# Patient Record
Sex: Male | Born: 1995 | Race: White | Hispanic: No | Marital: Married | Smoking: Never smoker
Health system: Southern US, Community
[De-identification: ages and names within clinical notes are randomized; demographics above are authoritative.]

## PROBLEM LIST (undated history)

## (undated) DIAGNOSIS — H9190 Unspecified hearing loss, unspecified ear: Secondary | ICD-10-CM

---
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
## 2016-12-08 ENCOUNTER — Encounter: Payer: Self-pay | Admitting: Emergency Medicine

## 2016-12-08 ENCOUNTER — Emergency Department
Admission: EM | Admit: 2016-12-08 | Discharge: 2016-12-08 | Disposition: A | Discharge status: Home / Self Care | Payer: Worker's Compensation | Attending: Emergency Medicine | Admitting: Emergency Medicine

## 2016-12-08 ENCOUNTER — Emergency Department: Payer: Worker's Compensation

## 2016-12-08 DIAGNOSIS — S91209A Unspecified open wound of unspecified toe(s) with damage to nail, initial encounter: Secondary | ICD-10-CM | POA: Diagnosis not present

## 2016-12-08 DIAGNOSIS — S92919A Unspecified fracture of unspecified toe(s), initial encounter for closed fracture: Secondary | ICD-10-CM

## 2016-12-08 DIAGNOSIS — W230XXA Caught, crushed, jammed, or pinched between moving objects, initial encounter: Secondary | ICD-10-CM | POA: Insufficient documentation

## 2016-12-08 DIAGNOSIS — Y990 Civilian activity done for income or pay: Secondary | ICD-10-CM | POA: Insufficient documentation

## 2016-12-08 DIAGNOSIS — Y9289 Other specified places as the place of occurrence of the external cause: Secondary | ICD-10-CM | POA: Diagnosis not present

## 2016-12-08 DIAGNOSIS — S92511A Displaced fracture of proximal phalanx of right lesser toe(s), initial encounter for closed fracture: Secondary | ICD-10-CM | POA: Insufficient documentation

## 2016-12-08 DIAGNOSIS — S91205A Unspecified open wound of left lesser toe(s) with damage to nail, initial encounter: Secondary | ICD-10-CM | POA: Insufficient documentation

## 2016-12-08 DIAGNOSIS — Y9389 Activity, other specified: Secondary | ICD-10-CM | POA: Diagnosis not present

## 2016-12-08 DIAGNOSIS — S99922A Unspecified injury of left foot, initial encounter: Secondary | ICD-10-CM | POA: Diagnosis present

## 2016-12-08 HISTORY — DX: Unspecified hearing loss, unspecified ear: H91.90

## 2016-12-08 MED ORDER — OXYCODONE-ACETAMINOPHEN 5-325 MG PO TABS
1.0000 | ORAL_TABLET | Freq: Once | ORAL | Status: AC
  Administered 2016-12-08: 1 via ORAL
  Filled 2016-12-08: qty 1

## 2016-12-08 MED ORDER — LIDOCAINE HCL (PF) 1 % IJ SOLN
INTRAMUSCULAR | Status: AC
  Filled 2016-12-08: qty 5

## 2016-12-08 MED ORDER — OXYCODONE-ACETAMINOPHEN 7.5-325 MG PO TABS: 1.0000 | ORAL_TABLET | Freq: Four times a day (QID) | ORAL | 0 refills | Status: AC | PRN

## 2016-12-08 MED ORDER — IBUPROFEN 600 MG PO TABS
600.0000 mg | ORAL_TABLET | Freq: Once | ORAL | Status: AC
  Administered 2016-12-08: 600 mg via ORAL
  Filled 2016-12-08: qty 1

## 2016-12-08 MED ORDER — IBUPROFEN 600 MG PO TABS: 600.0000 mg | ORAL_TABLET | Freq: Four times a day (QID) | ORAL | 0 refills | Status: AC | PRN

## 2016-12-08 NOTE — ED Triage Notes (Signed)
At work a roller went over left toes--had steel toe boot on but the 5th toe was injured as the steel collaped

## 2016-12-08 NOTE — Discharge Instructions (Signed)
Keep toes buddy taped and wear open shoe until evaluation by podiatry

## 2016-12-08 NOTE — ED Provider Notes (Signed)
[facility] Emergency Department Provider Note  ____________________________________________   First MD Initiated Contact with Patient 12/08/16 1326     (approximate)  I have reviewed the triage vital signs and the nursing notes.   HISTORY  Chief Complaint Foot Injury    HPI Larry Osborne is a 21 y.o. male patient complaining of pain with no injury to the fifth toe left foot. Patient state even though wearing steel toed shoes at a roller crashed his toe. Patient rated his pain as a 2/10. No palliative measures prior to arrival. Patient is able to bear weight.   Past Medical History:  Diagnosis Date  . Hearing difficulty     There are no active problems to display for this patient.   History reviewed. No pertinent surgical history.  Prior to Admission medications   Medication Sig Start Date End Date Taking? Authorizing Provider  ibuprofen (ADVIL,MOTRIN) 600 MG tablet Take 1 tablet (600 mg total) by mouth every 6 (six) hours as needed. 12/08/16   Joni ReiningSmith, Nahomy Limburg K, PA-C  oxyCODONE-acetaminophen (PERCOCET) 7.5-325 MG tablet Take 1 tablet by mouth every 6 (six) hours as needed for severe pain. 12/08/16   Joni ReiningSmith, Tremaine Earwood K, PA-C    Allergies Patient has no known allergies.  No family history on file.  Social History Social History  Substance Use Topics  . Smoking status: Never Smoker  . Smokeless tobacco: Former NeurosurgeonUser  . Alcohol use Yes    Review of Systems  Constitutional: No fever/chills Eyes: No visual changes. ENT: No sore throat. Cardiovascular: Denies chest pain. Respiratory: Denies shortness of breath. Gastrointestinal: No abdominal pain.  No nausea, no vomiting.  No diarrhea.  No constipation. Genitourinary: Negative for dysuria. Musculoskeletal: Left fifth toe pain Skin: Negative for rash. Neurological: Negative for headaches, focal weakness or numbness.   ____________________________________________   PHYSICAL  EXAM:  VITAL SIGNS: ED Triage Vitals  Enc Vitals Group     BP 12/08/16 1248 (!) 121/45     Pulse Rate 12/08/16 1248 (!) 50     Resp 12/08/16 1248 14     Temp 12/08/16 1248 98.1 F (36.7 C)     Temp Source 12/08/16 1248 Oral     SpO2 12/08/16 1248 99 %     Weight 12/08/16 1248 235 lb (106.6 kg)     Height 12/08/16 1248 5\' 11"  (1.803 m)     Head Circumference --      Peak Flow --      Pain Score 12/08/16 1247 2     Pain Loc --      Pain Edu? --      Excl. in GC? --     Constitutional: Alert and oriented. Well appearing and in no acute distress. Cardiovascular: Normal rate, regular rhythm. Grossly normal heart sounds.  Good peripheral circulation. Respiratory: Normal respiratory effort.  No retractions. Lungs CTAB. Gastrointestinal: Soft and nontender. No distention. No abdominal bruits. No CVA tenderness. Musculoskeletal: Obvious edema to the fifth toe left foot. Neurologic:  Normal speech and language. No gross focal neurologic deficits are appreciated. No gait instability. Skin:  Skin is warm, dry and intact. No rash noted. Partial nail avulsion fifth toe left foot. Psychiatric: Mood and affect are normal. Speech and behavior are normal.  ____________________________________________   LABS (all labs ordered are listed, but only abnormal results are displayed)  Labs Reviewed - No data to display ____________________________________________  EKG   ____________________________________________  RADIOLOGY  Dg Toe 5th Left  Result Date:  12/08/2016 CLINICAL DATA:  Pain.  No known injury. EXAM: DG TOE 5TH LEFT COMPARISON:  None. FINDINGS: There is a fracture through the tip of the left little toe distal phalanx. Mildly displaced distal fragments. No subluxation or dislocation. IMPRESSION: Displaced fracture through the tip of the left toe distal phalanx. Electronically Signed   By: Charlett Nose M.D.   On: 12/08/2016 13:54    _X-ray reveals displaced tuft fracture of the  fifth digit left foot ___________________________________________   PROCEDURES  Procedure(s) performed: None  Procedures  Critical Care performed: No  ____________________________________________   INITIAL IMPRESSION / ASSESSMENT AND PLAN / ED COURSE  Pertinent labs & imaging results that were available during my care of the patient were reviewed by me and considered in my medical decision making (see chart for details).  Mildly displaced fracture fifth digit left foot with partial nail avulsion. Discussed x-ray findings with patient. Applied a digital block and totally removed nail fold fifth digit left foot. Patient given discharge care instructions and advised follow-up with podiatry for further evaluation and treatment.      ____________________________________________   FINAL CLINICAL IMPRESSION(S) / ED DIAGNOSES  Final diagnoses:  Fracture of distal phalanx of toe  Avulsion of toenail, initial encounter      NEW MEDICATIONS STARTED DURING THIS VISIT:  New Prescriptions   IBUPROFEN (ADVIL,MOTRIN) 600 MG TABLET    Take 1 tablet (600 mg total) by mouth every 6 (six) hours as needed.   OXYCODONE-ACETAMINOPHEN (PERCOCET) 7.5-325 MG TABLET    Take 1 tablet by mouth every 6 (six) hours as needed for severe pain.     Note:  This document was prepared using Dragon voice recognition software and may include unintentional dictation errors.    Joni Reining, PA-C 12/08/16 1458    Minna Antis, MD 12/08/16 [phone]

## 2016-12-10 ENCOUNTER — Ambulatory Visit: Payer: Managed Care, Other (non HMO) | Admitting: Podiatry

## 2018-03-24 IMAGING — DX DG TOE 5TH 2+V*L*
3 series · 3 of 3 positions shown · non-contrast
Comparison: None.

CLINICAL DATA: Pain.  No known injury.

EXAM:
DG TOE 5TH LEFT

[toe ap]
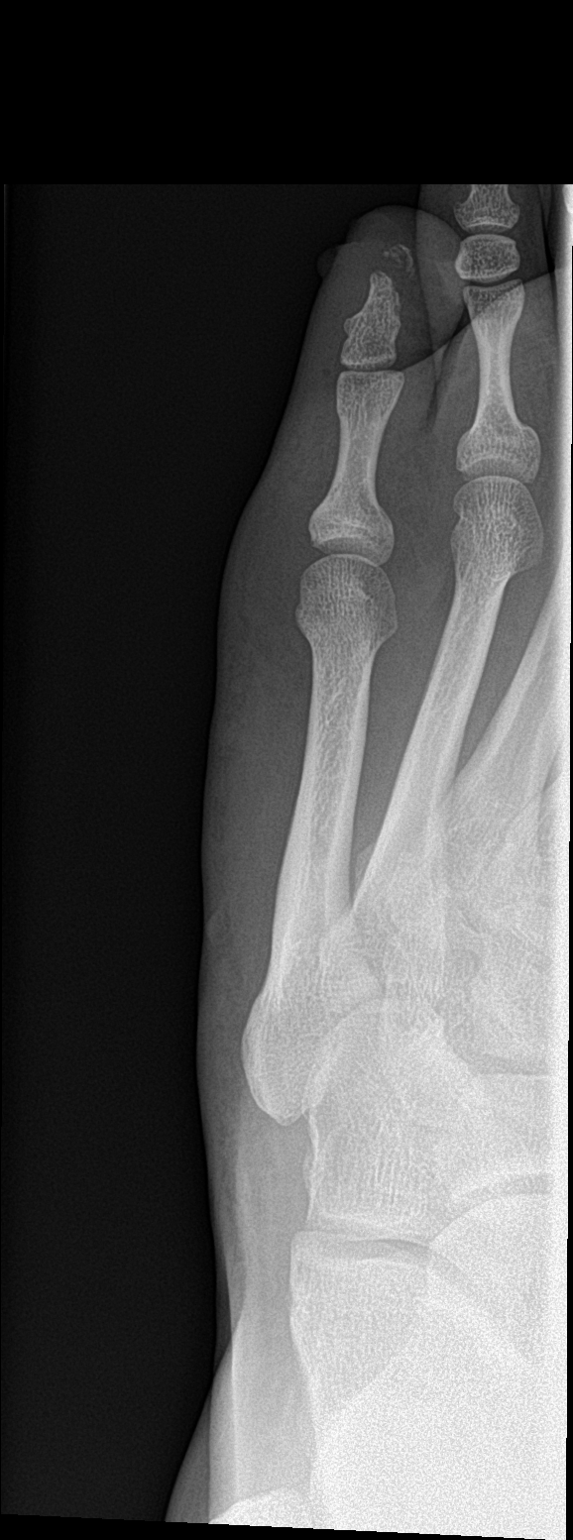

[toe obl]
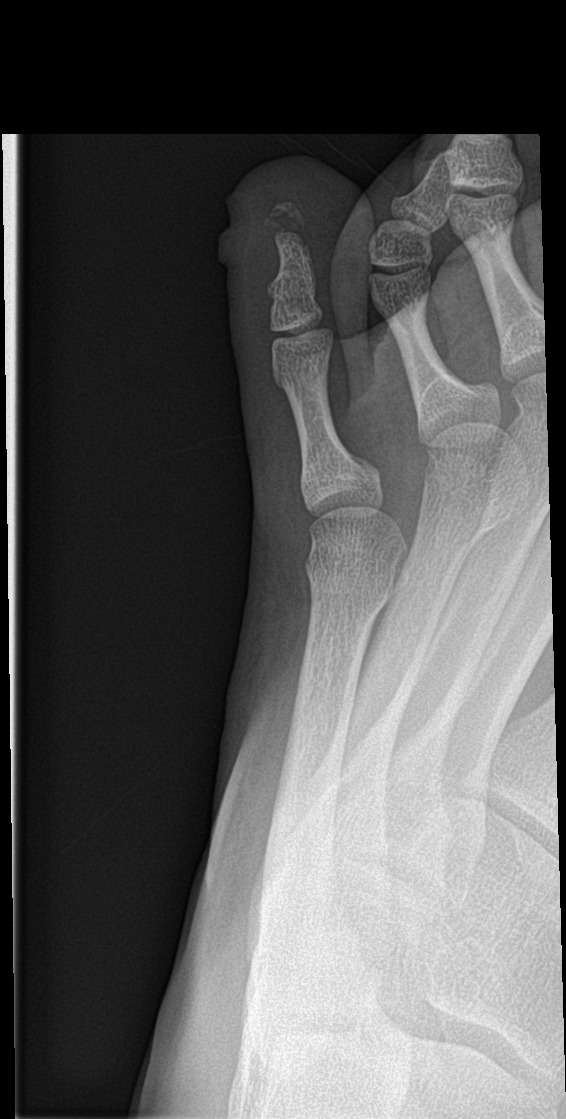

[toe lat]
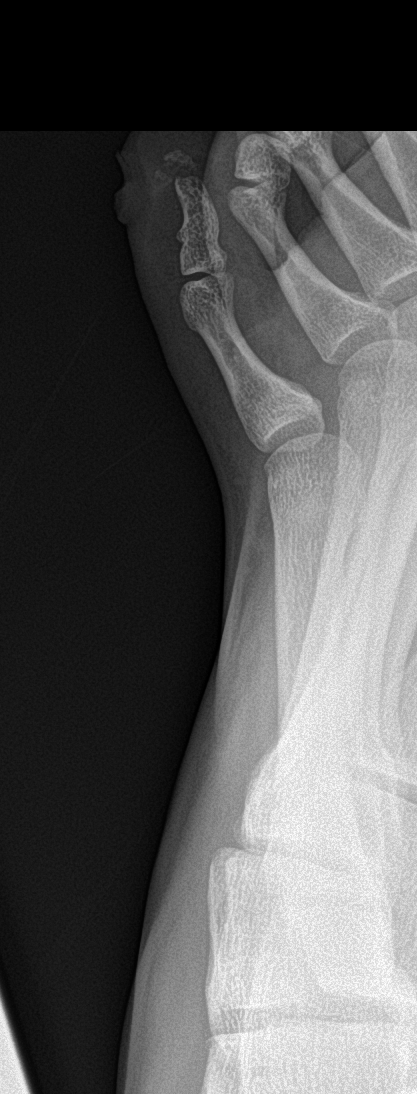

[3 of 3 positions shown; findings below may reference images not displayed]

FINDINGS: There is a fracture through the tip of the left little toe distal
phalanx. Mildly displaced distal fragments. No subluxation or
dislocation.
IMPRESSION: Displaced fracture through the tip of the left toe distal phalanx.

## 2022-11-21 ENCOUNTER — Ambulatory Visit
Admission: EM | Admit: 2022-11-21 | Discharge: 2022-11-21 | Disposition: A | Discharge status: Home / Self Care | Payer: 59 | Attending: Orthopedic Surgery | Admitting: Orthopedic Surgery

## 2022-11-21 DIAGNOSIS — S2096XA Insect bite (nonvenomous) of unspecified parts of thorax, initial encounter: Secondary | ICD-10-CM

## 2022-11-21 DIAGNOSIS — S70361A Insect bite (nonvenomous), right thigh, initial encounter: Secondary | ICD-10-CM

## 2022-11-21 DIAGNOSIS — W57XXXA Bitten or stung by nonvenomous insect and other nonvenomous arthropods, initial encounter: Secondary | ICD-10-CM | POA: Diagnosis not present

## 2022-11-21 MED ORDER — DOXYCYCLINE HYCLATE 100 MG PO CAPS: 100.0000 mg | ORAL_CAPSULE | Freq: Two times a day (BID) | ORAL | 0 refills | Status: AC

## 2022-11-21 NOTE — ED Provider Notes (Signed)
[facility]    CSN: [id number] Arrival date & time: 11/21/22  0809      History   Chief Complaint Chief Complaint  Patient presents with   Insect Bite    HPI Larry Osborne is a 27 y.o. male.  Presents to the urgent care for evaluation of insect bites.  He had some ticks on him several days ago, developed some rash along his right posterior thigh consistent with insect bite/spider bite.  X 2.  He also had a bite mark with some redness along his left rib region.  He denies any systemic symptoms such as fevers chills body aches joint pain headaches.  No significant change in erythema over the last couple days.  HPI  Past Medical History:  Diagnosis Date   Hearing difficulty     There are no problems to display for this patient.   History reviewed. No pertinent surgical history.     Home Medications    Prior to Admission medications   Medication Sig Start Date End Date Taking? Authorizing Provider  doxycycline (VIBRAMYCIN) 100 MG capsule Take 1 capsule (100 mg total) by mouth 2 (two) times daily for 10 days. 11/21/22 12/01/22 Yes Evon Slack, PA-C  ibuprofen (ADVIL,MOTRIN) 600 MG tablet Take 1 tablet (600 mg total) by mouth every 6 (six) hours as needed. 12/08/16   Joni Reining, PA-C  oxyCODONE-acetaminophen (PERCOCET) 7.5-325 MG tablet Take 1 tablet by mouth every 6 (six) hours as needed for severe pain. 12/08/16   Joni Reining, PA-C    Family History History reviewed. No pertinent family history.  Social History Social History   Tobacco Use   Smoking status: Never   Smokeless tobacco: Former  Building services engineer Use: Some days  Substance Use Topics   Alcohol use: Yes   Drug use: Yes    Types: Marijuana     Allergies   Patient has no known allergies.   Review of Systems Review of Systems   Physical Exam Triage Vital Signs ED Triage Vitals  Enc Vitals Group     BP 11/21/22 0820 (!) 147/87     Pulse Rate 11/21/22 0820 72      Resp --      Temp 11/21/22 0820 98.6 F (37 C)     Temp Source 11/21/22 0820 Oral     SpO2 11/21/22 0820 98 %     Weight 11/21/22 0819 255 lb (115.7 kg)     Height 11/21/22 0819 5\' 11"  (1.803 m)     Head Circumference --      Peak Flow --      Pain Score 11/21/22 0819 2     Pain Loc --      Pain Edu? --      Excl. in GC? --    No data found.  Updated Vital Signs BP (!) 147/87 (BP Location: Left Arm)   Pulse 72   Temp 98.6 F (37 C) (Oral)   Ht 5\' 11"  (1.803 m)   Wt 255 lb (115.7 kg)   SpO2 98%   BMI 35.57 kg/m   Visual Acuity Right Eye Distance:   Left Eye Distance:   Bilateral Distance:    Right Eye Near:   Left Eye Near:    Bilateral Near:     Physical Exam Constitutional:      Appearance: He is well-developed.  HENT:     Head: Normocephalic and atraumatic.  Eyes:     Conjunctiva/sclera:  Conjunctivae normal.  Cardiovascular:     Rate and Rhythm: Normal rate.  Pulmonary:     Effort: Pulmonary effort is normal. No respiratory distress.  Musculoskeletal:        General: Normal range of motion.     Cervical back: Normal range of motion.  Skin:    General: Skin is warm.     Findings: No rash.     Comments: Right posterior thigh has 2 bite marks, no fluctuance or induration.  There are about 3 to 4 cm in diameter and slightly erythematous when a single central scab.  Similar appearing insect bite mark along the left rib cage area mid axillary line, all bite mark showed no signs of fluctuance or induration.  No signs of any significant cellulitis  Neurological:     Mental Status: He is alert and oriented to person, place, and time.  Psychiatric:        Behavior: Behavior normal.        Thought Content: Thought content normal.      UC Treatments / Results  Labs (all labs ordered are listed, but only abnormal results are displayed) Labs Reviewed - No data to display  EKG   Radiology No results found.  Procedures Procedures (including critical care  time)  Medications Ordered in UC Medications - No data to display  Initial Impression / Assessment and Plan / UC Course  I have reviewed the triage vital signs and the nursing notes.  Pertinent labs & imaging results that were available during my care of the patient were reviewed by me and considered in my medical decision making (see chart for details).     27 year old male with 3 insect bite marks along his right posterior thigh and left rib cage area.  Vital signs are stable, afebrile.  No systemic symptoms.  He noted numerous ticks on his body several days ago.  Will go ahead and treat with doxycycline.  He understands signs and symptoms return to the urgent care for. Final Clinical Impressions(s) / UC Diagnoses   Final diagnoses:  Insect bite of right thigh, initial encounter  Insect bite of thoracic wall, unspecified whether front or back, initial encounter   Discharge Instructions   None    ED Prescriptions     Medication Sig Dispense Auth. Provider   doxycycline (VIBRAMYCIN) 100 MG capsule Take 1 capsule (100 mg total) by mouth 2 (two) times daily for 10 days. 20 capsule Evon Slack, PA-C      PDMP not reviewed this encounter.   Evon Slack, [place] 11/21/22 [phone]

## 2022-11-21 NOTE — ED Triage Notes (Signed)
Pt c/o bug bite x1week   Pt has 4  bites, 2 on back of right leg, one under right arm, and one on left flank.  Pt is concerned as the bumps have scabbed over and the one on the left flank has a red streak going toward the torso.   Pt has pulled off ticks with the white stripes on its back and does not know if he was bitten by a dear tick or a spider.
